# Patient Record
Sex: Female | Born: 1970 | Race: White | Hispanic: No | Marital: Married | Smoking: Never smoker
Health system: Southern US, Community
[De-identification: ages and names within clinical notes are randomized; demographics above are authoritative.]

## PROBLEM LIST (undated history)

## (undated) DIAGNOSIS — Z22322 Carrier or suspected carrier of Methicillin resistant Staphylococcus aureus: Secondary | ICD-10-CM

## (undated) DIAGNOSIS — I341 Nonrheumatic mitral (valve) prolapse: Secondary | ICD-10-CM

## (undated) DIAGNOSIS — E119 Type 2 diabetes mellitus without complications: Secondary | ICD-10-CM

## (undated) HISTORY — PX: LAMINECTOMY: SHX219

## (undated) HISTORY — PX: SPINAL FUSION: SHX223

---

## 2018-07-08 ENCOUNTER — Encounter (HOSPITAL_COMMUNITY): Payer: Self-pay

## 2018-07-08 ENCOUNTER — Emergency Department (HOSPITAL_COMMUNITY)
Admission: EM | Admit: 2018-07-08 | Discharge: 2018-07-09 | Disposition: A | Discharge status: Home / Self Care | Payer: Medicaid - Out of State | Attending: Emergency Medicine | Admitting: Emergency Medicine

## 2018-07-08 ENCOUNTER — Other Ambulatory Visit: Payer: Self-pay

## 2018-07-08 DIAGNOSIS — Z79899 Other long term (current) drug therapy: Secondary | ICD-10-CM | POA: Insufficient documentation

## 2018-07-08 DIAGNOSIS — E119 Type 2 diabetes mellitus without complications: Secondary | ICD-10-CM | POA: Insufficient documentation

## 2018-07-08 DIAGNOSIS — Y929 Unspecified place or not applicable: Secondary | ICD-10-CM | POA: Insufficient documentation

## 2018-07-08 DIAGNOSIS — S39012A Strain of muscle, fascia and tendon of lower back, initial encounter: Secondary | ICD-10-CM

## 2018-07-08 DIAGNOSIS — W2210XA Striking against or struck by unspecified automobile airbag, initial encounter: Secondary | ICD-10-CM | POA: Insufficient documentation

## 2018-07-08 DIAGNOSIS — S161XXA Strain of muscle, fascia and tendon at neck level, initial encounter: Secondary | ICD-10-CM | POA: Diagnosis not present

## 2018-07-08 DIAGNOSIS — Y998 Other external cause status: Secondary | ICD-10-CM | POA: Diagnosis not present

## 2018-07-08 DIAGNOSIS — Z7984 Long term (current) use of oral hypoglycemic drugs: Secondary | ICD-10-CM | POA: Diagnosis not present

## 2018-07-08 DIAGNOSIS — Y9389 Activity, other specified: Secondary | ICD-10-CM | POA: Insufficient documentation

## 2018-07-08 DIAGNOSIS — S199XXA Unspecified injury of neck, initial encounter: Secondary | ICD-10-CM | POA: Diagnosis present

## 2018-07-08 HISTORY — DX: Type 2 diabetes mellitus without complications: E11.9

## 2018-07-08 HISTORY — DX: Carrier or suspected carrier of methicillin resistant Staphylococcus aureus: Z22.322

## 2018-07-08 HISTORY — DX: Nonrheumatic mitral (valve) prolapse: I34.1
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
## 2018-07-09 ENCOUNTER — Emergency Department (HOSPITAL_COMMUNITY): Payer: Medicaid - Out of State

## 2018-07-09 LAB — I-STAT BETA HCG BLOOD, ED (MC, WL, AP ONLY): I-stat hCG, quantitative: <5 m[IU]/mL (ref <5)

## 2018-07-09 MED ORDER — OXYCODONE-ACETAMINOPHEN 5-325 MG PO TABS
1.0000 | ORAL_TABLET | Freq: Once | ORAL | Status: AC
  Administered 2018-07-09: 02:00:00 1 via ORAL
  Filled 2018-07-09: qty 1

## 2018-07-09 MED ORDER — NAPROXEN 250 MG PO TABS
500.0000 mg | ORAL_TABLET | Freq: Once | ORAL | Status: DC
  Filled 2018-07-09: qty 2
  Filled 2018-07-09: qty 1

## 2018-07-09 MED ORDER — LIDOCAINE 5 % EX PTCH
1.0000 | MEDICATED_PATCH | Freq: Once | CUTANEOUS | Status: DC
  Administered 2018-07-09: 1 via TRANSDERMAL
  Filled 2018-07-09: qty 1

## 2018-07-09 MED ORDER — OXYCODONE-ACETAMINOPHEN 5-325 MG PO TABS
1.0000 | ORAL_TABLET | Freq: Once | ORAL | Status: AC
  Administered 2018-07-09: 05:00:00 1 via ORAL
  Filled 2018-07-09: qty 1

## 2018-07-09 MED ORDER — LIDOCAINE 5 % EX PTCH: 1.0000 | MEDICATED_PATCH | CUTANEOUS | 0 refills | Status: AC

## 2018-07-09 MED ORDER — ONDANSETRON 4 MG PO TBDP
4.0000 mg | ORAL_TABLET | Freq: Once | ORAL | Status: AC
  Administered 2018-07-09: 4 mg via ORAL
  Filled 2018-07-09: qty 1

## 2018-07-09 NOTE — Discharge Instructions (Addendum)
Alternate ice and heat to areas of injury 3-4 times per day to limit inflammation and spasm.  Avoid strenuous activity and heavy lifting.  Use Lidoderm patches as needed.  We recommend continued use of your home Norco for pain in addition to Flexeril for muscle spasms.  Do not drive or drink alcohol after taking these medications as they may make you drowsy and impair your judgment.  We recommend follow-up with a primary care doctor to ensure resolution of symptoms.  Return to the ED for any new or concerning symptoms.

## 2018-07-09 NOTE — ED Notes (Signed)
Patient transported to X-ray 
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
## 2018-07-09 NOTE — ED Notes (Signed)
Patient transported to CT 
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
